# Patient Record
Sex: Male | Born: 1985 | Hispanic: No | Marital: Married | Smoking: Former smoker
Health system: Southern US, Community
[De-identification: ages and names within clinical notes are randomized; demographics above are authoritative.]

## PROBLEM LIST (undated history)

## (undated) DIAGNOSIS — R569 Unspecified convulsions: Secondary | ICD-10-CM

---

## 2019-10-28 ENCOUNTER — Encounter (HOSPITAL_COMMUNITY): Payer: Self-pay | Admitting: *Deleted

## 2019-10-28 ENCOUNTER — Other Ambulatory Visit: Payer: Self-pay

## 2019-10-28 ENCOUNTER — Emergency Department (HOSPITAL_COMMUNITY)
Admission: EM | Admit: 2019-10-28 | Discharge: 2019-10-28 | Disposition: A | Discharge status: Home / Self Care | Payer: HRSA Program | Attending: Emergency Medicine | Admitting: Emergency Medicine

## 2019-10-28 ENCOUNTER — Emergency Department (HOSPITAL_COMMUNITY): Payer: HRSA Program

## 2019-10-28 DIAGNOSIS — Z87891 Personal history of nicotine dependence: Secondary | ICD-10-CM | POA: Insufficient documentation

## 2019-10-28 DIAGNOSIS — U071 COVID-19: Secondary | ICD-10-CM | POA: Insufficient documentation

## 2019-10-28 DIAGNOSIS — R0602 Shortness of breath: Secondary | ICD-10-CM

## 2019-10-28 DIAGNOSIS — R52 Pain, unspecified: Secondary | ICD-10-CM | POA: Diagnosis present

## 2019-10-28 LAB — POC SARS CORONAVIRUS 2 AG -  ED: SARS Coronavirus 2 Ag: POSITIVE — AB

## 2019-10-28 MED ORDER — ACETAMINOPHEN 325 MG PO TABS
650.0000 mg | ORAL_TABLET | Freq: Once | ORAL | Status: AC
  Administered 2019-10-28: 650 mg via ORAL
  Filled 2019-10-28: qty 2

## 2019-10-28 NOTE — Discharge Instructions (Addendum)
Thank you for allowing us to care for you today.   Please return to the emergency department if you have any new or worsening symptoms.  You tested positive for covid-19 today.   Medications- You can take medications to help treat your symptoms: -Tylenol for fever and body aches. Please take as prescribed on the bottle. -Over the coutner cough medicine such as mucinex, robitussin, or other brands. -Flonase or saline nasal spray for nasal congestion -Vitamins as recommended by CDC  Treatment- This is a virus and unfortunately there are no antibitotics approved to treat this virus at this time. It is important to monitor your symptoms closely: -You should have a theremometer at home to check your temperature when feeling feverish. -Use a pulse ox meter to measure your oxygen when feeling short of breath.  -If your fever is over 100.4 despite taking tylenol or if your oxygen level drops below 94% these are reasons to rturn to the emergency department for further evaluation. Please call the emergency department before you come to make us aware.    We recommend you self-isolate for 10 days and to inform your work/family/friends that you has the virus.  They will need to self-quarantine for 14 days to monitor for symptoms.    Again: symptoms of shortness of breath, chest pain, difficulty breathing, new onset of confusion, any symptoms that are concerning. If any of these symptoms you should come to emergency department for evaluation.   I hope you feel better soon  

## 2019-10-28 NOTE — ED Provider Notes (Signed)
[facility] EMERGENCY DEPARTMENT Provider Note   CSN: [id number] Arrival date & time: 10/28/19  1940     History Chief Complaint  Patient presents with  . Generalized Body Aches    Scott Trujillo is a 34 y.o. male with no known past medical history presenting to emergency department today with chief complaint of generalized body aches x3 days.  Patient states he moved here from [place] x 2 months ago.  Patient also endorses nasal congestion and nonproductive cough.  He has been taking Tylenol sinus with symptom improvement.  He has decreased appetite but denies any nausea or emesis.  He also admits to subjective fever and chills.  He denies sore throat, headache, neck pain, chest pain, shortness of breath, urinary symptoms, diarrhea.  Denies loss of sense of taste or smell.  His children have been attending public school and have similar symptoms.  He denies any contact with anyone known positive for COVID-19.  History provided by patient with additional history obtained from chart review.     History reviewed. No pertinent past medical history.  There are no problems to display for this patient.   History reviewed. No pertinent surgical history.     History reviewed. No pertinent family history.  Social History   Tobacco Use  . Smoking status: Former Research scientist (life sciences)  . Smokeless tobacco: Never Used  Substance Use Topics  . Alcohol use: Not Currently  . Drug use: Not Currently    Home Medications Prior to Admission medications   Not on File    Allergies    Shellfish allergy  Review of Systems   Review of Systems All other systems are reviewed and are negative for acute change except as noted in the HPI.  Physical Exam Updated Vital Signs BP 102/72 (BP Location: Right Arm)   Pulse 84   Temp 99.2 F (37.3 C) (Oral)   Resp 16   Ht 6\' 5"  (1.956 m)   Wt 88 kg   SpO2 97%   BMI 23.01 kg/m   Physical Exam Vitals and nursing note reviewed.  Constitutional:       General: He is not in acute distress.    Appearance: He is not ill-appearing.  HENT:     Head: Normocephalic and atraumatic.     Right Ear: Tympanic membrane and external ear normal.     Left Ear: Tympanic membrane and external ear normal.     Nose: Nose normal.     Mouth/Throat:     Mouth: Mucous membranes are moist.     Pharynx: Oropharynx is clear.  Eyes:     General: No scleral icterus.       Right eye: No discharge.        Left eye: No discharge.     Extraocular Movements: Extraocular movements intact.     Conjunctiva/sclera: Conjunctivae normal.     Pupils: Pupils are equal, round, and reactive to light.  Neck:     Vascular: No JVD.     Comments: No meningeal signs Cardiovascular:     Rate and Rhythm: Normal rate and regular rhythm.     Pulses: Normal pulses.          Radial pulses are 2+ on the right side and 2+ on the left side.     Heart sounds: Normal heart sounds.  Pulmonary:     Comments: Lungs clear to auscultation in all fields. Symmetric chest rise. No wheezing, rales, or rhonchi. Abdominal:     Comments:  Abdomen is soft, non-distended, and non-tender in all quadrants. No rigidity, no guarding. No peritoneal signs.  Musculoskeletal:        General: Normal range of motion.     Cervical back: Normal range of motion.  Skin:    General: Skin is warm and dry.     Capillary Refill: Capillary refill takes less than 2 seconds.     Findings: No rash.  Neurological:     Mental Status: He is oriented to person, place, and time.     GCS: GCS eye subscore is 4. GCS verbal subscore is 5. GCS motor subscore is 6.     Comments: Fluent speech, no facial droop.  Psychiatric:        Behavior: Behavior normal.     ED Results / Procedures / Treatments   Labs (all labs ordered are listed, but only abnormal results are displayed) Labs Reviewed  POC SARS CORONAVIRUS 2 AG -  ED - Abnormal; Notable for the following components:      Result Value   SARS Coronavirus 2 Ag  POSITIVE (*)    All other components within normal limits    EKG None  Radiology DG Chest Port 1 View  Result Date: 10/28/2019 CLINICAL DATA:  Shortness of breath and chills for 1 week EXAM: PORTABLE CHEST 1 VIEW COMPARISON:  None. FINDINGS: Cardiac shadow is within normal limits. The lungs are well aerated bilaterally. No focal infiltrate or sizable effusion is seen. No bony abnormality is noted. IMPRESSION: No active disease. Electronically Signed   By: Alcide Clever M.D.   On: 10/28/2019 21:36    Procedures Procedures (including critical care time)  Medications Ordered in ED Medications  acetaminophen (TYLENOL) tablet 650 mg (650 mg Oral Given 10/28/19 2219)    ED Course  I have reviewed the triage vital signs and the nursing notes.  Pertinent labs & imaging results that were available during my care of the patient were reviewed by me and considered in my medical decision making (see chart for details).    MDM Rules/Calculators/A&P                     I have reviewed patient's EMR to obtain pertinent PMH to assist in MDM.  Symptoms and exam most suggestive of uncomplicated viral illness. DDX incluldes viral URI/LRI, COVID-19.  No recent travel. No known exposures to confirmed COVID-19.    Exam is benign.  Normal WOB. No fever, tachypnea, tachycardia, hypoxemia. Lungs are CTAB. I viewed pt's chest xray and it does not suggest acute infectious processes. Patient has no significant h/o immunocompromise. Doubt bacterial bronchitis or pneumonia.  No signs or symptoms to suggest strep pharyngitis.  No clinical signs of severe illness, dehydration, to warrant further emergent work up in ER. Covid test is positive. Patient ambulated in the emergency department without respiratory distress or hypoxia, SpO2 >95% on room air. Given reassuring physical exam, symptoms, will discharge with symptomatic treatment. Recommend telemedicine PCP f/u in the next 2-3 days for persistent symptoms  for  further guidance. Self-isolation instructions discussed. Pt was given home self-isolation instructions and instructions for family members. Pt understands signs and symptoms that would warrant return to ED.  Pt comfortable and agreeable with POC.  Scott Trujillo was evaluated in Emergency Department on 10/28/2019 for the symptoms described in the history of present illness. He was evaluated in the context of the global COVID-19 pandemic, which necessitated consideration that the patient might be at risk  for infection with the SARS-CoV-2 virus that causes COVID-19. Institutional protocols and algorithms that pertain to the evaluation of patients at risk for COVID-19 are in a state of rapid change based on information released by regulatory bodies including the CDC and federal and state organizations. These policies and algorithms were followed during the patient's care in the ED.   Portions of this note were generated with Scientist, clinical (histocompatibility and immunogenetics). Dictation errors may occur despite best attempts at proofreading.   Final Clinical Impression(s) / ED Diagnoses Final diagnoses:  COVID-19 virus infection    Rx / DC Orders ED Discharge Orders    None       Kathyrn Lass 10/28/19 2225    Vanetta Mulders, MD 11/01/19 1220

## 2019-10-28 NOTE — ED Notes (Signed)
Pt recently moved here from [place].    Pt states his kids have been running fevers.

## 2019-10-28 NOTE — ED Triage Notes (Signed)
Pt c/o body aches, chills, congested last week.

## 2019-12-21 ENCOUNTER — Emergency Department (HOSPITAL_COMMUNITY): Payer: Self-pay

## 2019-12-21 ENCOUNTER — Encounter (HOSPITAL_COMMUNITY): Payer: Self-pay | Admitting: *Deleted

## 2019-12-21 ENCOUNTER — Other Ambulatory Visit: Payer: Self-pay

## 2019-12-21 ENCOUNTER — Emergency Department (HOSPITAL_COMMUNITY)
Admission: EM | Admit: 2019-12-21 | Discharge: 2019-12-21 | Disposition: A | Discharge status: Home / Self Care | Payer: Self-pay | Attending: Emergency Medicine | Admitting: Emergency Medicine

## 2019-12-21 DIAGNOSIS — Z20822 Contact with and (suspected) exposure to covid-19: Secondary | ICD-10-CM | POA: Insufficient documentation

## 2019-12-21 DIAGNOSIS — Z79899 Other long term (current) drug therapy: Secondary | ICD-10-CM | POA: Insufficient documentation

## 2019-12-21 DIAGNOSIS — M25552 Pain in left hip: Secondary | ICD-10-CM

## 2019-12-21 DIAGNOSIS — G40901 Epilepsy, unspecified, not intractable, with status epilepticus: Secondary | ICD-10-CM | POA: Insufficient documentation

## 2019-12-21 DIAGNOSIS — R531 Weakness: Secondary | ICD-10-CM | POA: Insufficient documentation

## 2019-12-21 DIAGNOSIS — Z87891 Personal history of nicotine dependence: Secondary | ICD-10-CM | POA: Insufficient documentation

## 2019-12-21 DIAGNOSIS — R569 Unspecified convulsions: Secondary | ICD-10-CM

## 2019-12-21 DIAGNOSIS — R29898 Other symptoms and signs involving the musculoskeletal system: Secondary | ICD-10-CM

## 2019-12-21 HISTORY — DX: Unspecified convulsions: R56.9

## 2019-12-21 LAB — CBC WITH DIFFERENTIAL/PLATELET
Abs Immature Granulocytes: 0.05 10*3/uL (ref 0.00–0.07)
Basophils Absolute: 0.1 10*3/uL (ref 0.0–0.1)
Basophils Relative: 0 %
Eosinophils Absolute: 0.2 10*3/uL (ref 0.0–0.5)
Eosinophils Relative: 1 %
HCT: 46.3 % (ref 39.0–52.0)
Hemoglobin: 14.7 g/dL (ref 13.0–17.0)
Immature Granulocytes: 0 %
Lymphocytes Relative: 28 %
Lymphs Abs: 3.4 10*3/uL (ref 0.7–4.0)
MCH: 30.7 pg (ref 26.0–34.0)
MCHC: 31.7 g/dL (ref 30.0–36.0)
MCV: 96.7 fL (ref 80.0–100.0)
Monocytes Absolute: 0.9 10*3/uL (ref 0.1–1.0)
Monocytes Relative: 7 %
Neutro Abs: 7.7 10*3/uL (ref 1.7–7.7)
Neutrophils Relative %: 64 %
Platelets: 265 10*3/uL (ref 150–400)
RBC: 4.79 MIL/uL (ref 4.22–5.81)
RDW: 12 % (ref 11.5–15.5)
WBC: 12.2 10*3/uL — ABNORMAL HIGH (ref 4.0–10.5)
nRBC: 0 % (ref 0.0–0.2)

## 2019-12-21 LAB — BASIC METABOLIC PANEL
Anion gap: 17 — ABNORMAL HIGH (ref 5–15)
BUN: 7 mg/dL (ref 6–20)
CO2: 17 mmol/L — ABNORMAL LOW (ref 22–32)
Calcium: 9.7 mg/dL (ref 8.9–10.3)
Chloride: 102 mmol/L (ref 98–111)
Creatinine, Ser: 1.24 mg/dL (ref 0.61–1.24)
GFR calc Af Amer: >60 mL/min (ref >60)
GFR calc non Af Amer: >60 mL/min (ref >60)
Glucose, Bld: 124 mg/dL — ABNORMAL HIGH (ref 70–99)
Potassium: 3.9 mmol/L (ref 3.5–5.1)
Sodium: 136 mmol/L (ref 135–145)

## 2019-12-21 LAB — RAPID URINE DRUG SCREEN, HOSP PERFORMED
Amphetamines: POSITIVE — AB
Barbiturates: NOT DETECTED
Benzodiazepines: POSITIVE — AB
Cocaine: POSITIVE — AB
Opiates: NOT DETECTED
Tetrahydrocannabinol: NOT DETECTED

## 2019-12-21 LAB — URINALYSIS, ROUTINE W REFLEX MICROSCOPIC
Bilirubin Urine: NEGATIVE
Glucose, UA: NEGATIVE mg/dL
Hgb urine dipstick: NEGATIVE
Ketones, ur: NEGATIVE mg/dL
Leukocytes,Ua: NEGATIVE
Nitrite: NEGATIVE
Protein, ur: 100 mg/dL — AB
Specific Gravity, Urine: 1.016 (ref 1.005–1.030)
pH: 6 (ref 5.0–8.0)

## 2019-12-21 LAB — I-STAT CHEM 8, ED
BUN: 5 mg/dL — ABNORMAL LOW (ref 6–20)
Calcium, Ion: 1.06 mmol/L — ABNORMAL LOW (ref 1.15–1.40)
Chloride: 104 mmol/L (ref 98–111)
Creatinine, Ser: 1.2 mg/dL (ref 0.61–1.24)
Glucose, Bld: 122 mg/dL — ABNORMAL HIGH (ref 70–99)
HCT: 43 % (ref 39.0–52.0)
Hemoglobin: 14.6 g/dL (ref 13.0–17.0)
Potassium: 3.7 mmol/L (ref 3.5–5.1)
Sodium: 139 mmol/L (ref 135–145)
TCO2: 19 mmol/L — ABNORMAL LOW (ref 22–32)

## 2019-12-21 LAB — SARS CORONAVIRUS 2 BY RT PCR (HOSPITAL ORDER, PERFORMED IN ~~LOC~~ HOSPITAL LAB): SARS Coronavirus 2: NEGATIVE

## 2019-12-21 LAB — CK: Total CK: 92 U/L (ref 49–397)

## 2019-12-21 LAB — ETHANOL: Alcohol, Ethyl (B): <10 mg/dL (ref <10)

## 2019-12-21 LAB — MAGNESIUM: Magnesium: 1.8 mg/dL (ref 1.7–2.4)

## 2019-12-21 MED ORDER — MIDAZOLAM HCL 5 MG/5ML IJ SOLN
INTRAMUSCULAR | Status: AC
  Administered 2019-12-21: 4 mg via INTRAVENOUS
  Filled 2019-12-21: qty 5

## 2019-12-21 MED ORDER — LORAZEPAM 2 MG/ML IJ SOLN
INTRAMUSCULAR | Status: AC
  Administered 2019-12-21: 1 mg via INTRAVENOUS
  Filled 2019-12-21: qty 1

## 2019-12-21 MED ORDER — LEVETIRACETAM IN NACL 1000 MG/100ML IV SOLN
1000.0000 mg | INTRAVENOUS | Status: AC
  Administered 2019-12-21 (×2): 1000 mg via INTRAVENOUS
  Filled 2019-12-21: qty 100

## 2019-12-21 MED ORDER — LEVETIRACETAM 500 MG PO TABS
500.0000 mg | ORAL_TABLET | Freq: Two times a day (BID) | ORAL | 1 refills | Status: AC
Start: 2019-12-21 — End: ?

## 2019-12-21 MED ORDER — MIDAZOLAM HCL 5 MG/5ML IJ SOLN: 4.0000 mg | Freq: Once | INTRAMUSCULAR | Status: AC

## 2019-12-21 MED ORDER — SODIUM CHLORIDE 0.9 % IV BOLUS
1000.0000 mL | Freq: Once | INTRAVENOUS | Status: AC
  Administered 2019-12-21: 1000 mL via INTRAVENOUS

## 2019-12-21 MED ORDER — LORAZEPAM 2 MG/ML IJ SOLN: 1.0000 mg | Freq: Once | INTRAMUSCULAR | Status: AC

## 2019-12-21 MED ORDER — SODIUM CHLORIDE 0.9 % IV SOLN: 2000.0000 mg | Freq: Once | INTRAVENOUS | Status: DC

## 2019-12-21 MED ORDER — NICOTINE 7 MG/24HR TD PT24
7.0000 mg | MEDICATED_PATCH | Freq: Once | TRANSDERMAL | Status: DC
  Administered 2019-12-21: 7 mg via TRANSDERMAL
  Filled 2019-12-21: qty 1

## 2019-12-21 NOTE — ED Provider Notes (Addendum)
Patient is transferred from [facility] for MRI of brain and C-spine.  Patient initially presented to [facility] after prolonged seizure activity.  Patient was given benzodiazepines and 2 g Keppra.  Patient was found to have left hip pain and left leg weakness after his seizure.  There was a concern for possible cervical spine pathology versus possible Todd's paralysis.  Patient transferred to [facility] ED for emergent MRI.  Upon arrival to the ED at [facility] the patient is alert and conversational.  He complains of pain in the left hip.  His distal left lower extremity appears to be neurovascular intact.  Range of motion of the left lower extremity is limited by pain in the patient's left hip.  X-rays of the left hip performed at [facility] are reviewed.  No evidence of acute fracture.   MRI brain and C-spine ordered.  Case discussed briefly with neurology team at [facility] - Dr. Wilford Corner. He agrees with plan to obtain MRI.    2230 MRI was without significant acute pathology.  Patient declines further observation as an admission.  He desires discharge.  He is alert and oriented x4.    His left leg weakness is completely resolved.  He is complaining of mild lateral left hip pain.  This is most likely related to his fall prior to arrival at [facility].  Plain films did not demonstrate fracture.  Case discussed with Dr. Otelia Limes of neurology.  He recommends outpatient Keppra 500 mg twice daily for prophylaxis.   Patient does understand the need to not drive or engage in other high risk activities until cleared by his regular neurologist in [place].  Patient is advised to not use cocaine, amphetamines, or other illegal drugs as these could precipitate a seizure.     Wynetta Fines, MD 12/21/19 3295    Wynetta Fines, MD 12/21/19 2227

## 2019-12-21 NOTE — Consult Note (Addendum)
[facility] TeleNeurology Consult Services  Stat Consult  Date of Service:   12/21/2019 13:51:53  Impression:     .  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 Also discussed the case with the staff. I would recommend continuing him on Keppra. He also reports of drinking regularly. I would recommend putting him on banana bag and CIWA protocol. I would recommend getting an EEG. Will recommend getting an MRI of the head and also C-spine. At this point exam is limited as he is lethargic but if he has any weakness in his arms or legs, would recommend doing these MRIs on stat basis. Otherwise he can be admitted for neurology follow-up and management with recommendations as above.  CT HEAD:  Not done yet  Metrics: [employer] Notification Time: 12/21/2019 13:49:39 Stamp Time: 12/21/2019 13:51:53 Callback Response Time: 12/21/2019 13:58:14  Our recommendations are outlined below.  Recommendations:     .  Start Keppra 1000 mg BID  Imaging Studies:     .  MRI Head  Other WorkUp:     .  Infectious/metabolic workup per primary team  Disposition: Neurology Follow Up Recommended  Sign Out:     .  Discussed with Emergency Department Provider  ----------------------------------------------------------------------------------------------------  Chief Complaint: seizures  History of Present Illness: Patient is a 34 year old Male.  34 year old male with past medical history of epilepsy since he was 34 years of age, came to the hospital because of recurrent seizures. Apparently he has not been taking any medications for the past few years. He had a generalized seizure at home. On route to the hospital, he had some more seizures. In the emergency room he was found to have a tonic posture of both upper extremities. Was  unresponsive. Was given 4 mg Ativan and then 4 mg of Versed. He was loaded with 2 g of IV Keppra. At this point he is lethargic. He is arousable. He is complaining of some neck pain.   Past Medical History:     . There is NO history of Hypertension     . There is NO history of Diabetes Mellitus     . There is NO history of Hyperlipidemia     . There is NO history of Atrial Fibrillation     . There is NO history of Coronary Artery Disease     . There is NO history of Stroke  Anticoagulant use:  No  Antiplatelet use: No    Examination: BP(138/61), Pulse(104), Blood Glucose(124) 1A: Level of Consciousness - Requires repeated stimulation to arouse + 2 1B: Ask Month and Age - Both Questions Right + 0 1C: Blink Eyes & Squeeze Hands - Performs Both Tasks + 0 2: Test Horizontal Extraocular Movements - Normal + 0 3: Test Visual Fields - No Visual Loss + 0 4: Test Facial Palsy (Use Grimace if Obtunded) - Normal symmetry + 0 5A: Test Left Arm Motor Drift - No Drift for 10 Seconds + 0 5B: Test Right Arm Motor Drift - No Drift for 10 Seconds + 0 6A: Test Left Leg Motor Drift - No Drift for 5 Seconds + 0 6B: Test Right Leg Motor Drift - No Drift for 5 Seconds + 0 7: Test Limb Ataxia (FNF/Heel-Shin) - No Ataxia + 0 8: Test Sensation - Normal; No sensory loss + 0 9: Test Language/Aphasia - Normal; No aphasia + 0  10: Test Dysarthria - Normal + 0 11: Test Extinction/Inattention - No abnormality + 0  NIHSS Score: 2   Patient/Family was informed the Neurology Consult would occur via TeleHealth consult by way of interactive audio and video telecommunications and consented to receiving care in this manner.  Patient is being evaluated for possible acute neurologic impairment and high probability of imminent or life-threatening deterioration. I spent total of 30 minutes providing care to this patient, including time for face to face visit via telemedicine, review of medical records, imaging studies  and discussion of findings with providers, the patient and/or family.   Dr Hedy Camara   [facility] [phone]  Case [id number]  Addendum: Received a call from ED and discussed the case in detail with the ED attending.  Patient is more awake now as the sedation effect has been wearing off.  Is complaining of some weakness in the left leg.  We talked about Todd paralysis but my concern would be right brain pathology versus a spinal cord pathology as cause of this.  I would suggest that MRIs of his brain and cervical spine and if needed MRI spine .  As these are not available in the local facility, he is going to be transferred and I would recommend follow-up with neurology over there ASAP.

## 2019-12-21 NOTE — ED Provider Notes (Signed)
[facility] EMERGENCY DEPARTMENT Provider Note   CSN: [id number] Arrival date & time: 12/21/19  1312     History Chief Complaint  Patient presents with  . Seizures    Scott Trujillo is a 34 y.o. male with reported distant history of seizures, most recently 6 years ago according to his wife, not on antiepileptics, presented to ED with suspected seizure.  His wife reports she found him in bed and initially called 911 thinking that he was in cardiac arrest, as he was unresponsive.  She pulled him out of bed and laid him on the ground.  Unclear whether CPR was performed.  EMS reports when they arrived the patient was postictal but with good pulses.  The suspect he had had a seizure.  The patient was grimacing and complaining of pain to them but speaking to them.  In route to the hospital immediately prior to arrival again the patient began stiffening up and had stopped talking altogether.  No LOC reported.  No loss of pulses.  His wife later tells me the patient has been having sleep walking incidents at night, and that he had an episode of confusion upon awakening at night last week.    The patient asks that the results of drug testing and lab testing be disclosed in private to him first, prior to any results being shared with his wife.   HPI     Past Medical History:  Diagnosis Date  . Seizures (HCC)    last seizure 6 years ago as of 12/21/19    There are no problems to display for this patient.   No past surgical history on file.     No family history on file.  Social History   Tobacco Use  . Smoking status: Former Games developer  . Smokeless tobacco: Never Used  Vaping Use  . Vaping Use: Never used  Substance Use Topics  . Alcohol use: Yes    Comment: occasionally   . Drug use: Not Currently    Home Medications Prior to Admission medications   Medication Sig Start Date End Date Taking? Authorizing Provider  Multiple Vitamin (MULTIVITAMIN WITH MINERALS) TABS  tablet Take 1 tablet by mouth daily.   Yes [provider]  Multiple Vitamins-Minerals (EMERGEN-C IMMUNE PO) Take 1 packet by mouth daily.   Yes [provider]    Allergies    Shellfish allergy  Review of Systems   Review of Systems  Constitutional: Negative for chills and fever.  HENT: Negative for ear pain and sore throat.   Eyes: Negative for photophobia and visual disturbance.  Respiratory: Negative for cough and shortness of breath.   Cardiovascular: Negative for chest pain and palpitations.  Gastrointestinal: Negative for abdominal pain, nausea and vomiting.  Genitourinary: Negative for dysuria and hematuria.  Musculoskeletal: Positive for arthralgias, back pain, joint swelling and myalgias.  Skin: Negative for pallor and rash.  Neurological: Positive for seizures and weakness.  Psychiatric/Behavioral: Negative for agitation and confusion.  All other systems reviewed and are negative.   Physical Exam Updated Vital Signs BP 100/87   Pulse (!) 108   Temp 97.9 F (36.6 C) (Oral)   Resp (!) 21   Ht 6\' 5"  (1.956 m)   Wt 88 kg   SpO2 100%   BMI 23.01 kg/m   Physical Exam Vitals and nursing note reviewed.  Constitutional:      Appearance: He is well-developed.  HENT:     Head: Normocephalic and atraumatic.  Eyes:  Conjunctiva/sclera: Conjunctivae normal.  Cardiovascular:     Rate and Rhythm: Regular rhythm. Tachycardia present.     Pulses: Normal pulses.  Pulmonary:     Effort: Pulmonary effort is normal. No respiratory distress.     Breath sounds: Normal breath sounds.  Abdominal:     General: There is no distension.     Palpations: Abdomen is soft.     Tenderness: There is no abdominal tenderness.  Musculoskeletal:        General: Tenderness present.     Cervical back: Neck supple.     Comments: TTP of the left hip, no visible deformity  Skin:    General: Skin is warm and dry.  Neurological:     Mental Status: He is alert.     GCS:  GCS eye subscore is 4. GCS verbal subscore is 4. GCS motor subscore is 5.     Comments: Sensation intact in lower extremities bilaterally 5/5 strength in right lower extremity 2/5 strength in left lower extremity with toe flexion/extension, 1/5 strength for hip or knee flexion/extension Normal strength and sensation in bilateral upper extremities      ED Results / Procedures / Treatments   Labs (all labs ordered are listed, but only abnormal results are displayed) Labs Reviewed  BASIC METABOLIC PANEL - Abnormal; Notable for the following components:      Result Value   CO2 17 (*)    Glucose, Bld 124 (*)    Anion gap 17 (*)    All other components within normal limits  CBC WITH DIFFERENTIAL/PLATELET - Abnormal; Notable for the following components:   WBC 12.2 (*)    All other components within normal limits  I-STAT CHEM 8, ED - Abnormal; Notable for the following components:   BUN 5 (*)    Glucose, Bld 122 (*)    Calcium, Ion 1.06 (*)    TCO2 19 (*)    All other components within normal limits  SARS CORONAVIRUS 2 BY RT PCR (HOSPITAL ORDER, PERFORMED IN [facility])  MAGNESIUM  ETHANOL  CK  RAPID URINE DRUG SCREEN, HOSP PERFORMED  URINALYSIS, ROUTINE W REFLEX MICROSCOPIC    EKG EKG Interpretation  Date/Time:  Sunday December 21 2019 13:32:42 EDT Ventricular Rate:  123 PR Interval:    QRS Duration: 93 QT Interval:  319 QTC Calculation: 457 R Axis:   89 Text Interpretation: Sinus tachycardia LAE, consider biatrial enlargement Minimal ST depression, inferior leads Baseline wander in lead(s) V4 No STEMI Confirmed by Alvester Chou [phone]) on 12/21/2019 2:30:51 PM   Radiology CT Head Wo Contrast  Result Date: 12/21/2019 CLINICAL DATA:  Seizure today. Abdominal pain with nausea and vomiting and right-sided facial pain post seizure. EXAM: CT HEAD WITHOUT CONTRAST TECHNIQUE: Contiguous axial images were obtained from the base of the skull through the vertex without  intravenous contrast. COMPARISON:  None. FINDINGS: Brain: Ventricles, cisterns and other CSF spaces are normal. There is no mass, mass effect, shift of midline structures or acute hemorrhage. No evidence of acute infarction. Vascular: No hyperdense vessel or unexpected calcification. Skull: Normal. Negative for fracture or focal lesion. Sinuses/Orbits: Orbits are normal. Moderate mucosal membrane thickening/opacification over the right maxillary sinus with mucosal membrane thickening over the left maxillary sinus. 1.1 cm calcified structure over the midline frontal sinus likely osteoma. Other: None. IMPRESSION: 1.  No acute findings. 2. Chronic sinus inflammatory disease. 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 Electronically Signed   By: Elberta Fortis M.D.  On: 12/21/2019 15:22   DG Chest Portable 1 View  Result Date: 12/21/2019 CLINICAL DATA:  Seizure.  Evaluate for infection. EXAM: PORTABLE CHEST 1 VIEW COMPARISON:  October 28, 2019 FINDINGS: The heart size and mediastinal contours are within normal limits. Both lungs are clear. The visualized skeletal structures are unremarkable. IMPRESSION: No active disease. Electronically Signed   By: Gerome Sam III M.D   On: 12/21/2019 14:07   DG HIP UNILAT WITH PELVIS 2-3 VIEWS LEFT  Result Date: 12/21/2019 CLINICAL DATA:  Left leg pain after a seizure today. EXAM: DG HIP (WITH OR WITHOUT PELVIS) 2-3V LEFT COMPARISON:  None. FINDINGS: There is no evidence of hip fracture or dislocation. There is no evidence of arthropathy or other focal bone abnormality. IMPRESSION: Normal exam. Electronically Signed   By: Francene Boyers M.D.   On: 12/21/2019 16:19    Procedures .Critical Care Performed by: Terald Sleeper, MD Authorized by: Terald Sleeper, MD   Critical care provider statement:    Critical care time (minutes):  45   Critical care was necessary to treat or prevent imminent or life-threatening deterioration of the following  conditions:  CNS failure or compromise   Critical care was time spent personally by me on the following activities:  Discussions with consultants, evaluation of patient's response to treatment, examination of patient, ordering and performing treatments and interventions, ordering and review of laboratory studies, ordering and review of radiographic studies, pulse oximetry, re-evaluation of patient's condition, obtaining history from patient or surrogate and review of old charts Comments:     Status epilepticus requiring repeat does of IV seizure medications, frequent neuro reassessments, neurology consultation   (including critical care time)  Medications Ordered in ED Medications  LORazepam (ATIVAN) injection 1 mg (1 mg Intravenous Given by Other 12/21/19 1323)  midazolam (VERSED) 5 MG/5ML injection 4 mg (4 mg Intravenous Given 12/21/19 1331)  levETIRAcetam (KEPPRA) IVPB 1000 mg/100 mL premix (0 mg Intravenous Stopped 12/21/19 1411)  sodium chloride 0.9 % bolus 1,000 mL (0 mLs Intravenous Stopped 12/21/19 1454)    ED Course  I have reviewed the triage vital signs and the nursing notes.  Pertinent labs & imaging results that were available during my care of the patient were reviewed by me and considered in my medical decision making (see chart for details).  34 yo male presenting to ED with concern for status epilepticus, reportedly had seizure at home for unknown period, found in bed by girlfriend, and EMS reporting he was initially post-ictal, never really awoke enough to converse, and then returned to his clenched presentation.  I did feel on arrival that the patient's presentation was highly consistent with seizure activity. After IV ativan and IV versed this activity abated, and his mental status has gradually improved back to baseline.  He is coherent now and can hold a conversation  IV ativan, versed and IV keppra (2grams) given in the ED  [facility] showed no acute focal findings such as  ICH No significant lab abnormalities per my review of BMP, CBC UA & UDS pending Neurology consulted - see separate note by Dr Karolee Ohs - patient will need spot EEG, continue with Keppra, observation overnight  Patient did report weakness in his left arm and his left leg during his teleneurology evaluation, after my initial ER arrival evaluation.  When I reassessed him, his left arm weakness had completely resolved.  He did have 2/5 strength in the left lower leg without sensory deficits on my exam.  On  my third reassessment, he had 3/5 strength (an improvement) in his left lower leg, but continued reporting significant pain in his left hip (fracture ruled out by xray ).    I spoke to Dr Mike Craze (neurology) again regarding possibility of Todd Paralysis vs spinal cord lesion, as he noted in his consult, and he recommends continued plan for emergent MRI imaging to rule out spinal lesion.  Given this recommendation, we are arranging for emergent ER-to-ER transfer to [facility] for MRI of the brain and C-spine per neurology recommendations.  If the patient's weakness has resolved upon his arrival at [facility], or if the imaging is negative, I would consider neurology re-consultation in the ER or hospitalist admission for EEG and neurology inpatient consult.  Patient and his wife made aware of plan and in agreement.   Clinical Course as of Dec 20 1652  Sun Dec 21, 2019  1331 Pt awake, jaw clenched, can speak once to say "My teeth hurt" but otherwise nonverbal, body clenched, I've ordered 1 mg ativan which was given, followed by 4 mg IV versed and a 2G keppra load.  Presentation concerning for status epilepticus   [MT]  1345 Awake and talking now, reports not on AED's, has body aches, will consult neurology regarding concern for status and recommendations regarding AED's and EEG   [MT]  1528 IMPRESSION: 1. No acute findings. 2. Chronic sinus inflammatory disease. 1.1 cm probable osteoma over the midline frontal  sinus.   [MT]  5625 Patient reporting he cannot move his left leg, reporting significant pain in his left hip, he can wiggle his toes with effort but little else on exam, no anesthesia or paresthesia.  We'll xray the hip and i'll discuss with hospitalist need for transfer for MRI   [MT]    Clinical Course User Index [MT] Seren Chaloux, Carola Rhine, MD    Final Clinical Impression(s) / ED Diagnoses Final diagnoses:  Left leg weakness  Status epilepticus [facility])    Rx / DC Orders ED Discharge Orders    None       Wyvonnia Dusky, MD 12/21/19 1656

## 2019-12-21 NOTE — ED Notes (Signed)
Pt still not able to give urine sample at this time.

## 2019-12-21 NOTE — ED Notes (Signed)
Patient verbalizes understanding of discharge instructions. Opportunity for questioning and answers were provided. Armband removed by staff, pt discharged from ED ambulatory.   

## 2019-12-21 NOTE — ED Triage Notes (Addendum)
Pt brought in by [facility] from home with c/o seizure. Wife reports last seizure 6 years ago and isn't on medications for seizure. CBG 160. After he came out of the seizure EMS reports pt was post ictal and c/o abdominal pain and n/v, right sided facial pain. Pt is not following commands. Dr. Renaye Rakers called to bedside. Pt starting to seize in triage.   Wife reported to EMS that pt has been having chest pain for about a month now intermittently, but hasn't had it checked out by a doctor.

## 2019-12-21 NOTE — ED Notes (Signed)
Patient transported to CT 

## 2019-12-21 NOTE — ED Notes (Signed)
Pt's wife came out of the room saying pt was having another seizure. RN went into room and pt was no longer seizing. Pt was alert but wasn't following commands for about 1 minute. Then, pt became more alert and was able to follow commands. Dr. Estell Harpin was notified and came to bedside to assess pt.

## 2019-12-21 NOTE — ED Notes (Signed)
Spoke with Phil at [facility] to set up transportation at this time. 

## 2019-12-21 NOTE — Discharge Instructions (Addendum)
Please return for any problem.  Follow-up with your regular care providers in [place] as instructed.  You will need follow-up with your seizure doctor in [place].  Take Keppra as prescribed.  Avoid excessive alcohol.  Do not use cocaine or other drugs.  Do not engage in high risk activities such as driving until you have been cleared by your regular seizure doctor.

## 2021-09-27 IMAGING — CT CT HEAD W/O CM
3 series · 16 of 47 positions shown, 19 images · non-contrast
Comparison: None.

CLINICAL DATA: Seizure today. Abdominal pain with nausea and
vomiting and right-sided facial pain post seizure.

EXAM:
CT HEAD WITHOUT CONTRAST
TECHNIQUE: Contiguous axial images were obtained from the base of the skull
through the vertex without intravenous contrast.

[Series 2: head w o · axial · 0.50mm/px · z∈[+1379,+1534]mm · 10 of 37 slices shown, 13 images]
[im 3/37  brain]
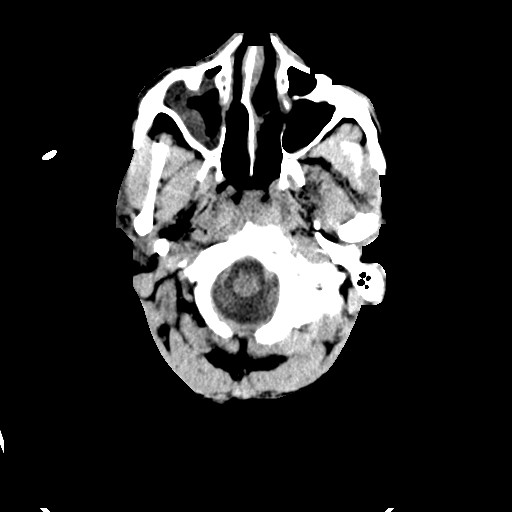
[im 3/37  bone]
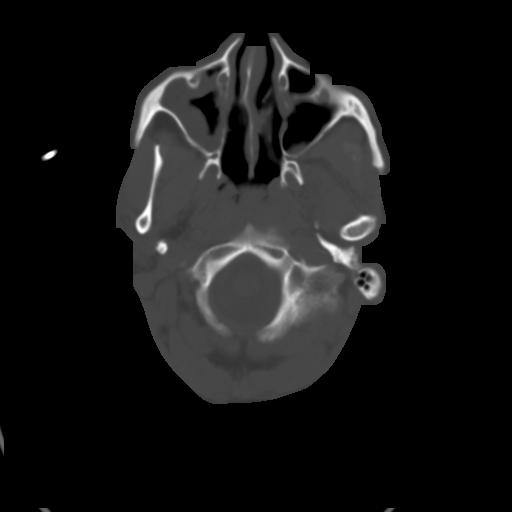
[im 7/37  brain]
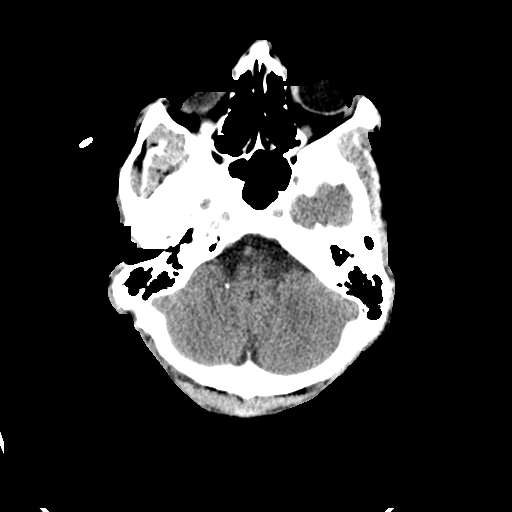
[im 10/37  brain]
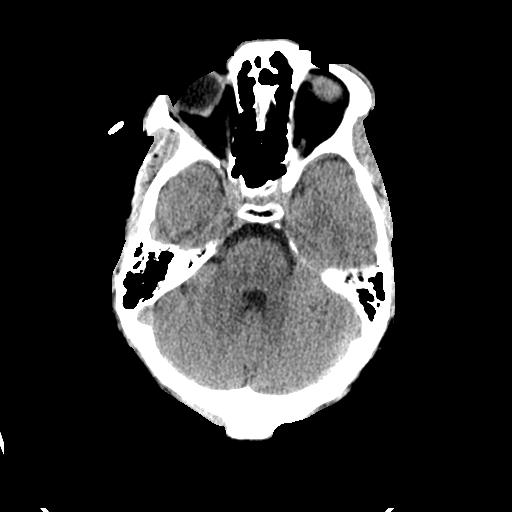
[im 13/37  brain]
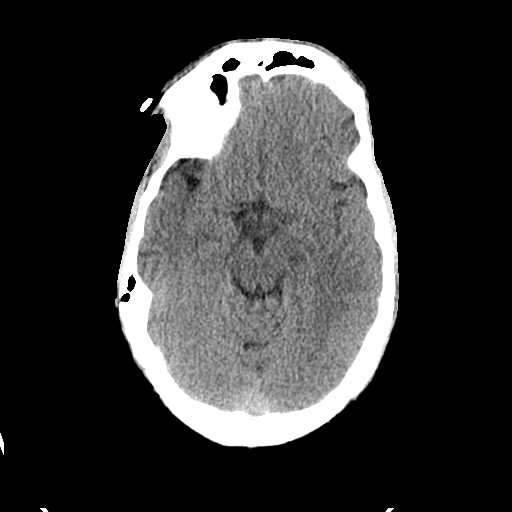
[im 17/37  brain]
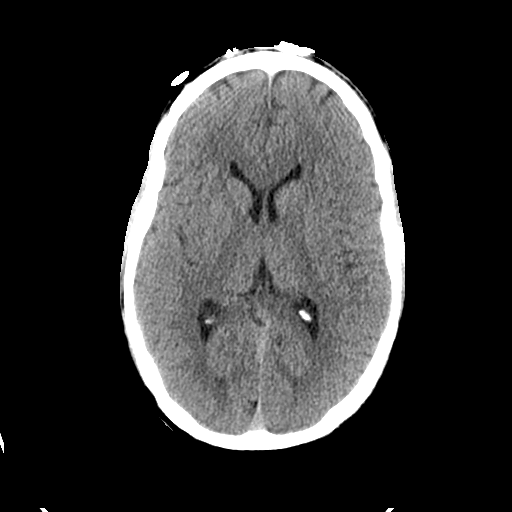
[im 17/37  bone]
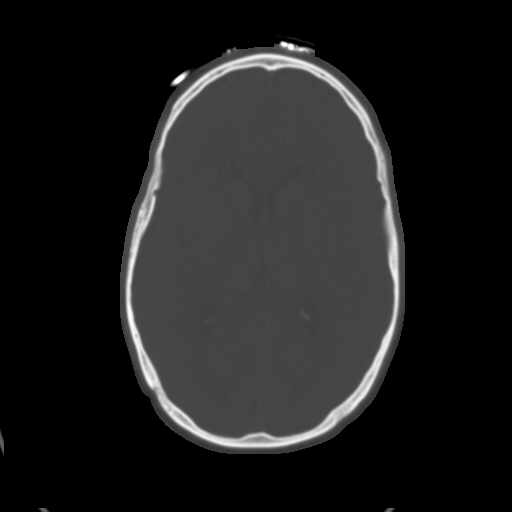
[im 20/37  brain]
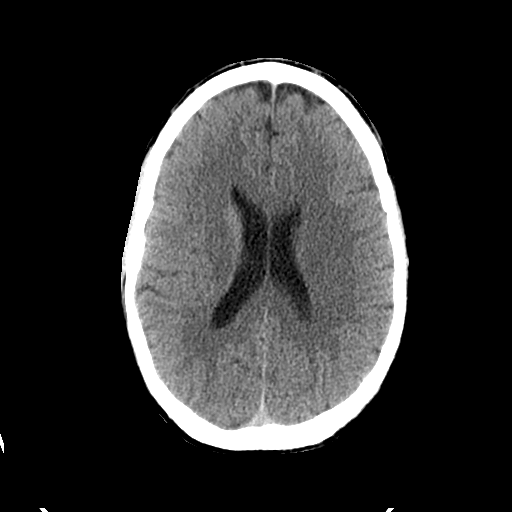
[im 24/37  brain]
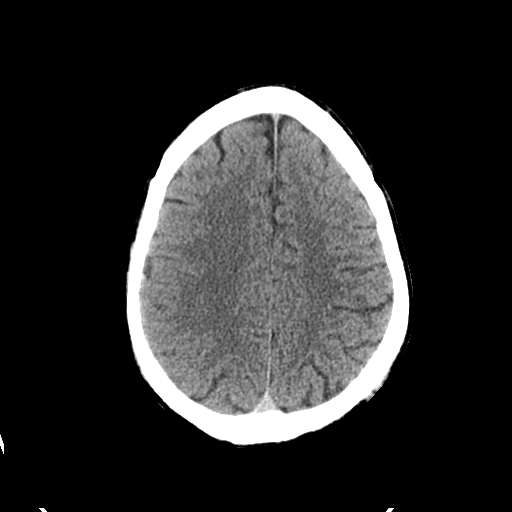
[im 28/37  brain]
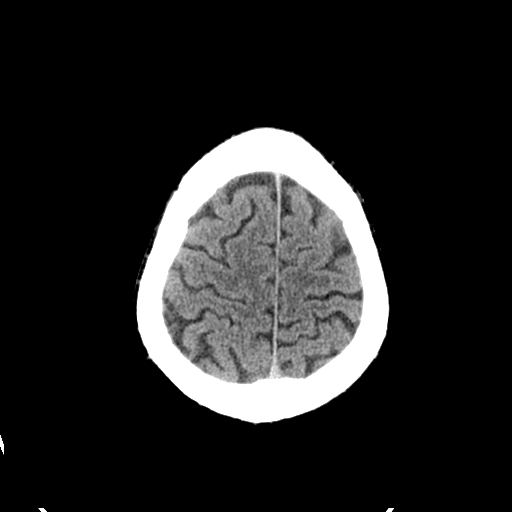
[im 30/37  brain]
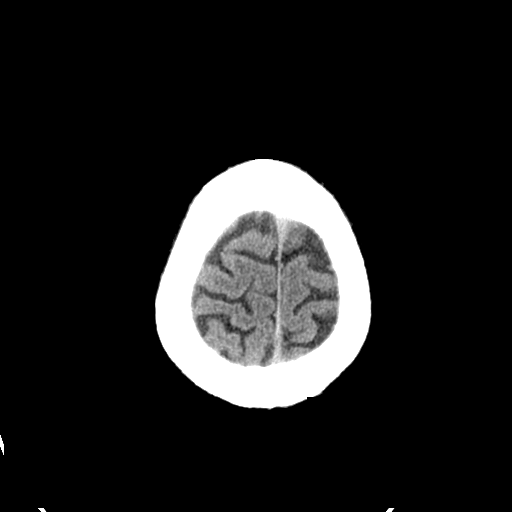
[im 30/37  bone]
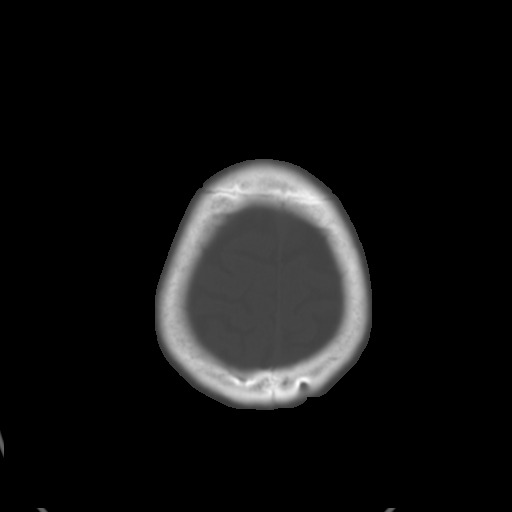
[im 34/37  brain]
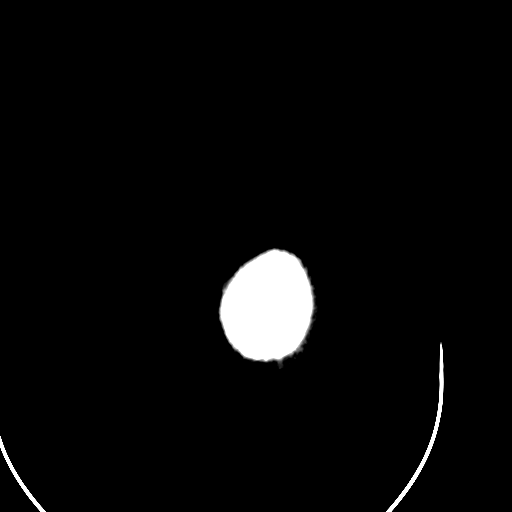

[Series 4: coronal soft · coronal · 0.37mm/px · 3 of 74 slices shown]
[im 25/74  brain]
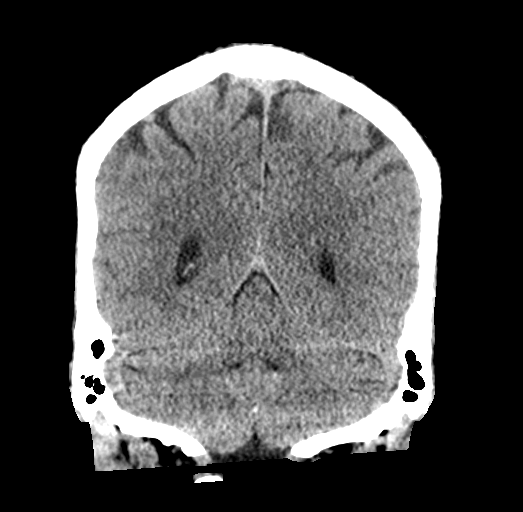
[im 33/74  brain]
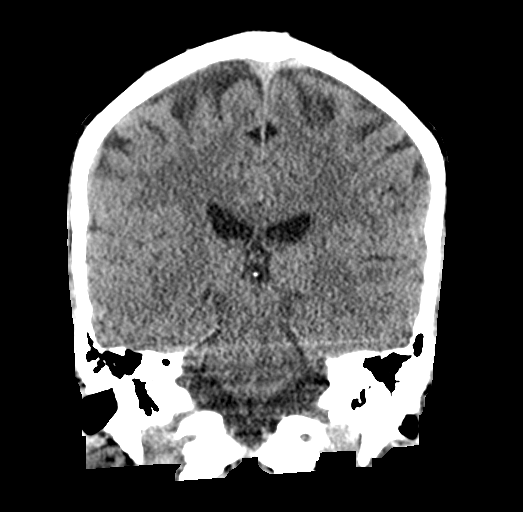
[im 41/74  brain]
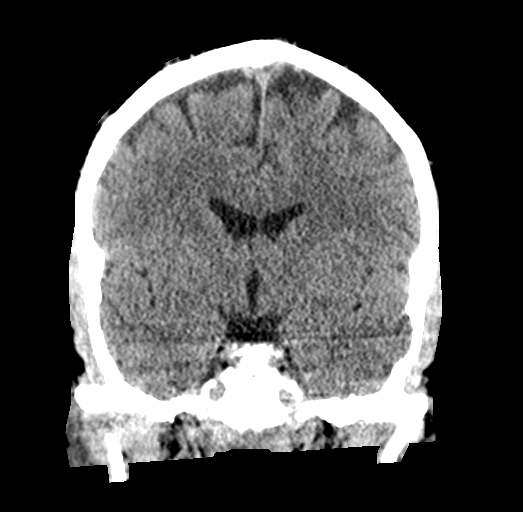

[Series 5: sagittal soft · sagittal · 0.38mm/px · 3 of 58 slices shown]
[im 20/58  brain]
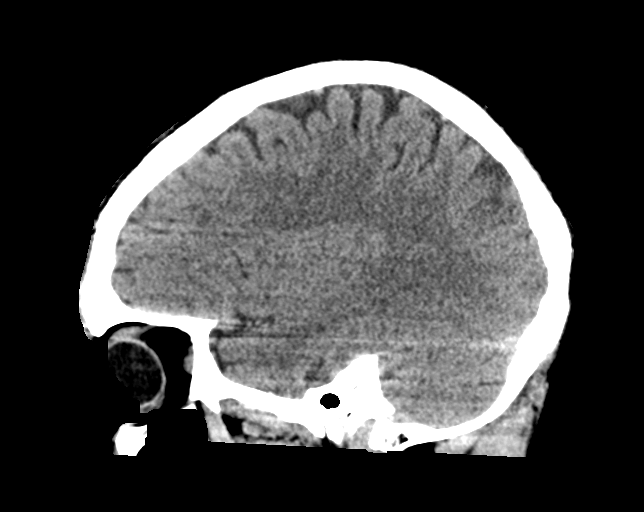
[im 29/58  brain]
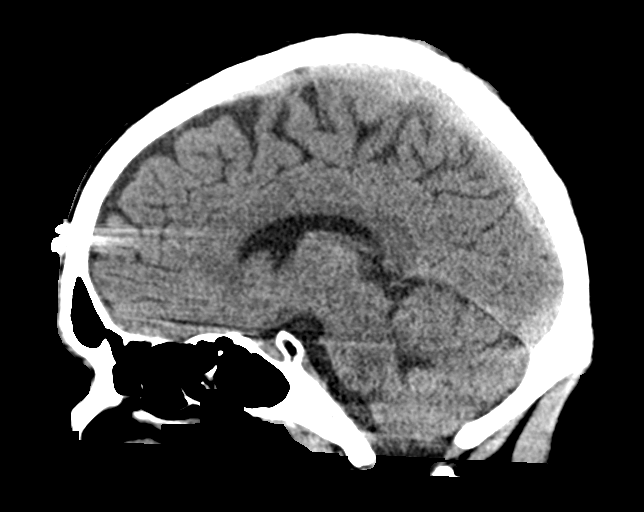
[im 39/58  brain]
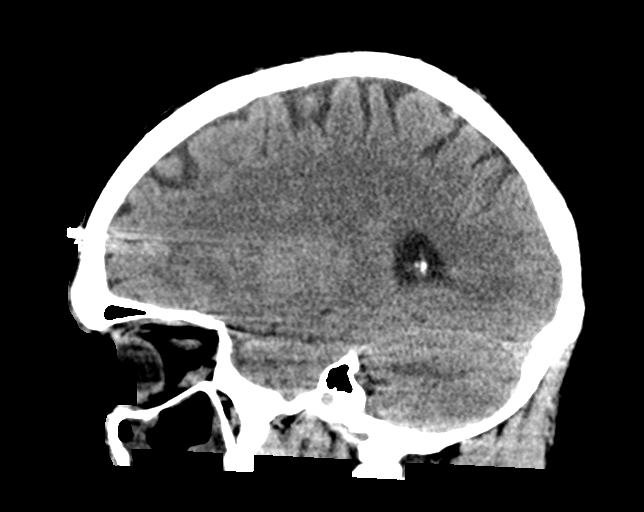

[16 of 47 positions shown; findings below may reference images not displayed]

FINDINGS: Brain: Ventricles, cisterns and other CSF spaces are normal. There
is no mass, mass effect, shift of midline structures or acute
hemorrhage. No evidence of acute infarction.

Vascular: No hyperdense vessel or unexpected calcification.

Skull: Normal. Negative for fracture or focal lesion.

Sinuses/Orbits: Orbits are normal. Moderate mucosal membrane
thickening/opacification over the right maxillary sinus with mucosal
membrane thickening over the left maxillary sinus. 1.1 cm calcified
structure over the midline frontal sinus likely osteoma.

Other: None.
IMPRESSION: 1.  No acute findings.

2. Chronic sinus inflammatory disease. 1.1 cm probable osteoma over
the midline frontal sinus.
